# Patient Record
Sex: Female | Born: 1970 | Race: White | Hispanic: No | Smoking: Current every day smoker
Health system: Southern US, Community
[De-identification: ages and names within clinical notes are randomized; demographics above are authoritative.]

## PROBLEM LIST (undated history)

## (undated) DIAGNOSIS — M25569 Pain in unspecified knee: Secondary | ICD-10-CM

## (undated) DIAGNOSIS — J45909 Unspecified asthma, uncomplicated: Secondary | ICD-10-CM

## (undated) HISTORY — PX: ORTHOPEDIC SURGERY: SHX850

---

## 2013-06-06 ENCOUNTER — Encounter (HOSPITAL_COMMUNITY): Payer: Self-pay | Admitting: Emergency Medicine

## 2013-06-06 ENCOUNTER — Emergency Department (HOSPITAL_COMMUNITY): Payer: Self-pay

## 2013-06-06 DIAGNOSIS — J45909 Unspecified asthma, uncomplicated: Secondary | ICD-10-CM | POA: Insufficient documentation

## 2013-06-06 DIAGNOSIS — W208XXA Other cause of strike by thrown, projected or falling object, initial encounter: Secondary | ICD-10-CM | POA: Insufficient documentation

## 2013-06-06 DIAGNOSIS — Z880 Allergy status to penicillin: Secondary | ICD-10-CM | POA: Insufficient documentation

## 2013-06-06 DIAGNOSIS — Y9289 Other specified places as the place of occurrence of the external cause: Secondary | ICD-10-CM | POA: Insufficient documentation

## 2013-06-06 DIAGNOSIS — S60229A Contusion of unspecified hand, initial encounter: Secondary | ICD-10-CM | POA: Insufficient documentation

## 2013-06-06 DIAGNOSIS — Y93H2 Activity, gardening and landscaping: Secondary | ICD-10-CM | POA: Insufficient documentation

## 2013-06-06 DIAGNOSIS — F17200 Nicotine dependence, unspecified, uncomplicated: Secondary | ICD-10-CM | POA: Insufficient documentation

## 2013-06-06 DIAGNOSIS — Z79899 Other long term (current) drug therapy: Secondary | ICD-10-CM | POA: Insufficient documentation

## 2013-06-06 NOTE — ED Notes (Signed)
Patient states she was helping her husband work on the deck when the deck fell on her hand.  Patient has bruising noted.

## 2013-06-07 ENCOUNTER — Emergency Department (HOSPITAL_COMMUNITY)
Admission: EM | Admit: 2013-06-07 | Discharge: 2013-06-07 | Disposition: A | Discharge status: Home / Self Care | Payer: Self-pay | Attending: Emergency Medicine | Admitting: Emergency Medicine

## 2013-06-07 DIAGNOSIS — S60221A Contusion of right hand, initial encounter: Secondary | ICD-10-CM

## 2013-06-07 HISTORY — DX: Unspecified asthma, uncomplicated: J45.909

## 2013-06-07 MED ORDER — OXYCODONE-ACETAMINOPHEN 5-325 MG PO TABS: 1.0000 | ORAL_TABLET | ORAL | Status: DC | PRN

## 2013-06-07 MED ORDER — OXYCODONE-ACETAMINOPHEN 5-325 MG PO TABS
1.0000 | ORAL_TABLET | Freq: Once | ORAL | Status: AC
  Administered 2013-06-07: 1 via ORAL
  Filled 2013-06-07: qty 1

## 2013-06-07 NOTE — ED Notes (Signed)
Dr. Glick at bedside.  

## 2013-06-07 NOTE — ED Provider Notes (Signed)
CSN: [id number]     Arrival date & time 06/06/13  2303 History   First MD Initiated Contact with Patient 06/07/13 0316     Chief Complaint  Patient presents with  . Hand Injury     (Consider location/radiation/quality/duration/timing/severity/associated sxs/prior Treatment) Patient is a 43 y.o. female presenting with hand injury. The history is provided by the patient.  Hand Injury She was working on a lawnmower when it fell and injured her right hand. She is complaining of pain in the radial aspect of the right hand. Pain is severe and she rates it at 10/10. She denies other injury.  Past Medical History  Diagnosis Date  . Asthma    Past Surgical History  Procedure Laterality Date  . Orthopedic surgery     No family history on file. History  Substance Use Topics  . Smoking status: Current Every Day Smoker  . Smokeless tobacco: Not on file  . Alcohol Use: Yes   OB History   Grav Para Term Preterm Abortions TAB SAB Ect Mult Living                 Review of Systems  All other systems reviewed and are negative.     Allergies  Codeine; Ibuprofen; Neurontin; Penicillins; and Tramadol  Home Medications   Current Outpatient Rx  Name  Route  Sig  Dispense  Refill  . PARoxetine (PAXIL) 40 MG tablet   Oral   Take 40 mg by mouth every morning.          BP 119/72  Pulse 77  Temp(Src) 98.1 F (36.7 C) (Oral)  Resp 18  Ht 5\' 1"  (1.549 m)  Wt 126 lb (57.153 kg)  BMI 23.82 kg/m2  SpO2 100%  LMP 05/23/2013 Physical Exam  Nursing note and vitals reviewed.  43 year old female, resting comfortably and in no acute distress. Vital signs are normal. Oxygen saturation is 100%, which is normal. Head is normocephalic and atraumatic. PERRLA, EOMI. Oropharynx is clear. Neck is nontender and supple without adenopathy or JVD. Back is nontender and there is no CVA tenderness. Lungs are clear without rales, wheezes, or rhonchi. Chest is nontender. Heart has regular rate and  rhythm without murmur. Abdomen is soft, flat, nontender without masses or hepatosplenomegaly and peristalsis is normoactive. Extremities have no cyanosis or edema, full range of motion is present. There is ecchymosis and mild soft tissue swelling of the right hand over the region of the first, second, third metacarpals. There is no swelling or tenderness of the fingers or thumb and no wrist swelling or tenderness. Distal neurovascular exam is intact with prompt capillary refill and normal sensation. Skin is warm and dry without rash. Neurologic: Mental status is normal, cranial nerves are intact, there are no motor or sensory deficits.  ED Course  Procedures (including critical care time) Imaging Review Dg Hand Complete Right  06/06/2013   CLINICAL DATA:  Lateral posterior hand pain after crush injury today.  EXAM: RIGHT HAND - COMPLETE 3+ VIEW  COMPARISON:  None.  FINDINGS: Degenerative changes in the interphalangeal joints. There is no evidence of fracture or dislocation. There is no evidence of arthropathy or other focal bone abnormality. Soft tissues are unremarkable.  IMPRESSION: Negative.   Electronically Signed   By: Burman Nieves M.D.   On: 06/06/2013 23:52   MDM   Final diagnoses:  Contusion of right hand    Contusion of the right hand. Patient has allergy to NSAIDs and codeine but states  she is able to take oxycodone. She is given a prescription for oxycodone-acetaminophen and is given routine contusion instructions.    Dione Boozeavid Mandi Mattioli, MD 06/07/13 [phone]

## 2013-06-07 NOTE — Discharge Instructions (Signed)
Hand Contusion A hand contusion is a deep bruise on your hand area. Contusions are the result of an injury that caused bleeding under the skin. The contusion may turn blue, purple, or yellow. Minor injuries will give you a painless contusion, but more severe contusions may stay painful and swollen for a few weeks. CAUSES  A contusion is usually caused by a blow, trauma, or direct force to an area of the body. SYMPTOMS   Swelling and redness of the injured area.  Discoloration of the injured area.  Tenderness and soreness of the injured area.  Pain. DIAGNOSIS  The diagnosis can be made by taking a history and performing a physical exam. An X-ray, CT scan, or MRI may be needed to determine if there were any associated injuries, such as broken bones (fractures). TREATMENT  Often, the best treatment for a hand contusion is resting, elevating, icing, and applying cold compresses to the injured area. Over-the-counter medicines may also be recommended for pain control. HOME CARE INSTRUCTIONS   Put ice on the injured area.  Put ice in a plastic bag.  Place a towel between your skin and the bag.  Leave the ice on for 15-20 minutes, 03-04 times a day.  Only take over-the-counter or prescription medicines as directed by your caregiver. Your caregiver may recommend avoiding anti-inflammatory medicines (aspirin, ibuprofen, and naproxen) for 48 hours because these medicines may increase bruising.  If told, use an elastic wrap as directed. This can help reduce swelling. You may remove the wrap for sleeping, showering, and bathing. If your fingers become numb, cold, or blue, take the wrap off and reapply it more loosely.  Elevate your hand with pillows to reduce swelling.  Avoid overusing your hand if it is painful. SEEK IMMEDIATE MEDICAL CARE IF:   You have increased redness, swelling, or pain in your hand.  Your swelling or pain is not relieved with medicines.  You have loss of feeling in  your hand or are unable to move your fingers.  Your hand turns cold or blue.  You have pain when you move your fingers.  Your hand becomes warm to the touch.  Your contusion does not improve in 2 days. MAKE SURE YOU:   Understand these instructions.  Will watch your condition.  Will get help right away if you are not doing well or get worse. Document Released: 08/04/2001 Document Revised: 11/07/2011 Document Reviewed: 08/06/2011 [facility] Patient Information 2014 [employer], [place].  Acetaminophen; Oxycodone tablets What is this medicine? ACETAMINOPHEN; OXYCODONE (a set a MEE noe fen; ox i KOE done) is a pain reliever. It is used to treat mild to moderate pain. This medicine may be used for other purposes; ask your health care provider or pharmacist if you have questions. COMMON BRAND NAME(S): Endocet, Magnacet, Narvox, Percocet, Perloxx, Primalev, Primlev, Roxicet, Xolox What should I tell my health care provider before I take this medicine? They need to know if you have any of these conditions: -brain tumor -Crohn's disease, inflammatory bowel disease, or ulcerative colitis -drug abuse or addiction -head injury -heart or circulation problems -if you often drink alcohol -kidney disease or problems going to the bathroom -liver disease -lung disease, asthma, or breathing problems -an unusual or allergic reaction to acetaminophen, oxycodone, other opioid analgesics, other medicines, foods, dyes, or preservatives -pregnant or trying to get pregnant -breast-feeding How should I use this medicine? Take this medicine by mouth with a full glass of water. Follow the directions on the prescription label. Take your  medicine at regular intervals. Do not take your medicine more often than directed. Talk to your pediatrician regarding the use of this medicine in children. Special care may be needed. Patients over 48 years old may have a stronger reaction and need a smaller dose. Overdosage:  If you think you have taken too much of this medicine contact a poison control center or emergency room at once. NOTE: This medicine is only for you. Do not share this medicine with others. What if I miss a dose? If you miss a dose, take it as soon as you can. If it is almost time for your next dose, take only that dose. Do not take double or extra doses. What may interact with this medicine? -alcohol -antihistamines -barbiturates like amobarbital, butalbital, butabarbital, methohexital, pentobarbital, phenobarbital, thiopental, and secobarbital -benztropine -drugs for bladder problems like solifenacin, trospium, oxybutynin, tolterodine, hyoscyamine, and methscopolamine -drugs for breathing problems like ipratropium and tiotropium -drugs for certain stomach or intestine problems like propantheline, homatropine methylbromide, glycopyrrolate, atropine, belladonna, and dicyclomine -general anesthetics like etomidate, ketamine, nitrous oxide, propofol, desflurane, enflurane, halothane, isoflurane, and sevoflurane -medicines for depression, anxiety, or psychotic disturbances -medicines for sleep -muscle relaxants -naltrexone -narcotic medicines (opiates) for pain -phenothiazines like perphenazine, thioridazine, chlorpromazine, mesoridazine, fluphenazine, prochlorperazine, promazine, and trifluoperazine -scopolamine -tramadol -trihexyphenidyl This list may not describe all possible interactions. Give your health care provider a list of all the medicines, herbs, non-prescription drugs, or dietary supplements you use. Also tell them if you smoke, drink alcohol, or use illegal drugs. Some items may interact with your medicine. What should I watch for while using this medicine? Tell your doctor or health care professional if your pain does not go away, if it gets worse, or if you have new or a different type of pain. You may develop tolerance to the medicine. Tolerance means that you will need a higher  dose of the medication for pain relief. Tolerance is normal and is expected if you take this medicine for a long time. Do not suddenly stop taking your medicine because you may develop a severe reaction. Your body becomes used to the medicine. This does NOT mean you are addicted. Addiction is a behavior related to getting and using a drug for a non-medical reason. If you have pain, you have a medical reason to take pain medicine. Your doctor will tell you how much medicine to take. If your doctor wants you to stop the medicine, the dose will be slowly lowered over time to avoid any side effects. You may get drowsy or dizzy. Do not drive, use machinery, or do anything that needs mental alertness until you know how this medicine affects you. Do not stand or sit up quickly, especially if you are an older patient. This reduces the risk of dizzy or fainting spells. Alcohol may interfere with the effect of this medicine. Avoid alcoholic drinks. There are different types of narcotic medicines (opiates) for pain. If you take more than one type at the same time, you may have more side effects. Give your health care provider a list of all medicines you use. Your doctor will tell you how much medicine to take. Do not take more medicine than directed. Call emergency for help if you have problems breathing. The medicine will cause constipation. Try to have a bowel movement at least every 2 to 3 days. If you do not have a bowel movement for 3 days, call your doctor or health care professional. Do not take Tylenol (acetaminophen) or  medicines that have acetaminophen with this medicine. Too much acetaminophen can be very dangerous. Many nonprescription medicines contain acetaminophen. Always read the labels carefully to avoid taking more acetaminophen. What side effects may I notice from receiving this medicine? Side effects that you should report to your doctor or health care professional as soon as possible: -allergic  reactions like skin rash, itching or hives, swelling of the face, lips, or tongue -breathing difficulties, wheezing -confusion -light headedness or fainting spells -severe stomach pain -unusually weak or tired -yellowing of the skin or the whites of the eyes  Side effects that usually do not require medical attention (report to your doctor or health care professional if they continue or are bothersome): -dizziness -drowsiness -nausea -vomiting This list may not describe all possible side effects. Call your doctor for medical advice about side effects. You may report side effects to FDA at 1-800-FDA-1088. Where should I keep my medicine? Keep out of the reach of children. This medicine can be abused. Keep your medicine in a safe place to protect it from theft. Do not share this medicine with anyone. Selling or giving away this medicine is dangerous and against the law. Store at room temperature between 20 and 25 degrees C (68 and 77 degrees F). Keep container tightly closed. Protect from light. This medicine may cause accidental overdose and death if it is taken by other adults, children, or pets. Flush any unused medicine down the toilet to reduce the chance of harm. Do not use the medicine after the expiration date. NOTE: This sheet is a summary. It may not cover all possible information. If you have questions about this medicine, talk to your doctor, pharmacist, or health care provider.  2014, [employer]. (2012-10-06 13:17:35)

## 2013-06-08 MED FILL — Oxycodone w/ Acetaminophen Tab 5-325 MG: ORAL | Qty: 6 | Status: AC

## 2013-07-02 ENCOUNTER — Encounter (HOSPITAL_COMMUNITY): Payer: Self-pay | Admitting: Emergency Medicine

## 2013-07-02 DIAGNOSIS — F17200 Nicotine dependence, unspecified, uncomplicated: Secondary | ICD-10-CM | POA: Insufficient documentation

## 2013-07-02 DIAGNOSIS — N949 Unspecified condition associated with female genital organs and menstrual cycle: Secondary | ICD-10-CM | POA: Insufficient documentation

## 2013-07-02 DIAGNOSIS — J45909 Unspecified asthma, uncomplicated: Secondary | ICD-10-CM | POA: Insufficient documentation

## 2013-07-02 DIAGNOSIS — Z880 Allergy status to penicillin: Secondary | ICD-10-CM | POA: Insufficient documentation

## 2013-07-02 DIAGNOSIS — N925 Other specified irregular menstruation: Secondary | ICD-10-CM | POA: Insufficient documentation

## 2013-07-02 DIAGNOSIS — N938 Other specified abnormal uterine and vaginal bleeding: Secondary | ICD-10-CM | POA: Insufficient documentation

## 2013-07-02 DIAGNOSIS — M25569 Pain in unspecified knee: Secondary | ICD-10-CM | POA: Insufficient documentation

## 2013-07-02 DIAGNOSIS — Z9889 Other specified postprocedural states: Secondary | ICD-10-CM | POA: Insufficient documentation

## 2013-07-02 NOTE — ED Notes (Signed)
Patient reports bilateral knee pain x 3 weeks. States history of torn tendon to right knee. Patient also reports heavy vaginal bleeding x 2 days.

## 2013-07-03 ENCOUNTER — Emergency Department (HOSPITAL_COMMUNITY)
Admission: EM | Admit: 2013-07-03 | Discharge: 2013-07-03 | Disposition: A | Discharge status: Home / Self Care | Payer: Self-pay | Attending: Emergency Medicine | Admitting: Emergency Medicine

## 2013-07-03 DIAGNOSIS — M25562 Pain in left knee: Secondary | ICD-10-CM

## 2013-07-03 DIAGNOSIS — M25561 Pain in right knee: Secondary | ICD-10-CM

## 2013-07-03 DIAGNOSIS — N938 Other specified abnormal uterine and vaginal bleeding: Secondary | ICD-10-CM

## 2013-07-03 HISTORY — DX: Pain in unspecified knee: M25.569

## 2013-07-03 LAB — WET PREP, GENITAL
TRICH WET PREP: NONE SEEN
WBC, Wet Prep HPF POC: NONE SEEN
Yeast Wet Prep HPF POC: NONE SEEN

## 2013-07-03 LAB — RPR

## 2013-07-03 LAB — HIV ANTIBODY (ROUTINE TESTING W REFLEX): HIV 1&2 Ab, 4th Generation: NONREACTIVE

## 2013-07-03 MED ORDER — MEDROXYPROGESTERONE ACETATE 10 MG PO TABS: 10.0000 mg | ORAL_TABLET | Freq: Every day | ORAL | Status: DC

## 2013-07-03 MED ORDER — MEDROXYPROGESTERONE ACETATE 10 MG PO TABS
10.0000 mg | ORAL_TABLET | Freq: Every day | ORAL | Status: DC
  Administered 2013-07-03: 10 mg via ORAL
  Filled 2013-07-03 (×2): qty 1

## 2013-07-03 MED ORDER — OXYCODONE-ACETAMINOPHEN 5-325 MG PO TABS: 1.0000 | ORAL_TABLET | ORAL | Status: DC | PRN

## 2013-07-03 MED ORDER — MEDROXYPROGESTERONE ACETATE 10 MG PO TABS
ORAL_TABLET | ORAL | Status: AC
  Filled 2013-07-03: qty 1

## 2013-07-03 MED ORDER — OXYCODONE-ACETAMINOPHEN 5-325 MG PO TABS
1.0000 | ORAL_TABLET | Freq: Once | ORAL | Status: AC
  Administered 2013-07-03: 1 via ORAL
  Filled 2013-07-03: qty 1

## 2013-07-03 NOTE — ED Provider Notes (Signed)
CSN: [id number]     Arrival date & time 07/02/13  2215 History   First MD Initiated Contact with Patient 07/03/13 0125     Chief Complaint  Patient presents with  . Knee Pain  . Vaginal Bleeding     (Consider location/radiation/quality/duration/timing/severity/associated sxs/prior Treatment) Patient is a 43 y.o. female presenting with knee pain and vaginal bleeding. The history is provided by the patient.  Knee Pain Vaginal Bleeding She injured her right knee about 3 weeks ago and has been in a knee brace since then. She's complaining of a brace causing her to break out around her knee and she continues to have pain in her knee. Since then, she has been having increasing pain in her left knee and notices that it locks on her. [facility] but that appointment is not for another 2 weeks. She states that she or a tendon in the medial aspect of her knee when she tripped over her dog. Also, if she is having heavy vaginal bleeding. Her menses started 2 days ago and she has been using 8 pads a day with passage of large clots. There is also been some suprapubic cramping. She rates her pain at 8/10. She took ibuprofen and acetaminophen with no relief. She took a dose of oxycodone-acetaminophen with good relief she does state that for the last 3 months, she has been having to periods a month. She is status post tubal ligation.  Past Medical History  Diagnosis Date  . Asthma   . Knee pain    Past Surgical History  Procedure Laterality Date  . Orthopedic surgery     History reviewed. No pertinent family history. History  Substance Use Topics  . Smoking status: Current Every Day Smoker  . Smokeless tobacco: Not on file  . Alcohol Use: Yes   OB History   Grav Para Term Preterm Abortions TAB SAB Ect Mult Living                 Review of Systems  Genitourinary: Positive for vaginal bleeding.  All other systems reviewed and are  negative.     Allergies  Codeine; Ibuprofen; Neurontin; Penicillins; and Tramadol  Home Medications   Prior to Admission medications   Medication Sig Start Date End Date Taking? Authorizing Provider  oxyCODONE-acetaminophen (PERCOCET/ROXICET) 5-325 MG per tablet Take 1 tablet by mouth every 4 (four) hours as needed for severe pain. 06/07/13   Dione Boozeavid Esta Carmon, MD  oxyCODONE-acetaminophen (PERCOCET/ROXICET) 5-325 MG per tablet Take 1 tablet by mouth every 4 (four) hours as needed. 06/07/13   Dione Boozeavid Braelee Herrle, MD  PARoxetine (PAXIL) 40 MG tablet Take 40 mg by mouth every morning.    Historical Provider, MD   BP 106/61  Pulse 75  Temp(Src) 97.8 F (36.6 C) (Oral)  Resp 18  Ht 5\' 1"  (1.549 m)  Wt 128 lb (58.06 kg)  BMI 24.20 kg/m2  SpO2 100%  LMP 06/30/2013 Physical Exam  Nursing note and vitals reviewed.  43 year old female, resting comfortably and in no acute distress. Vital signs are normal. Oxygen saturation is 100%, which is normal. Head is normocephalic and atraumatic. PERRLA, EOMI. Oropharynx is clear. Neck is nontender and supple without adenopathy or JVD. Back is nontender and there is no CVA tenderness. Lungs are clear without rales, wheezes, or rhonchi. Chest is nontender. Heart has regular rate and rhythm without murmur. Abdomen is soft, flat, with mild suprapubic tenderness. There is no  rebound or guarding. There are no masses or hepatosplenomegaly and peristalsis is normoactive. Pelvic: Normal external female genitalia. Moderate amount of dark red blood present in the vaginal vault without any active bleeding from the cervix. Cervix is closed. Fundus is top normal in size. There is no adnexal masses or tenderness and there is no cervical motion tenderness. Extremities have no cyanosis or edema. Small effusion is present in the right knee. There is tenderness palpation over the medial aspect of the knee. There is no instability. Lachman test is negative. McMurray's tests suggests a  very subtle click medially. Left knee has no tenderness or effusion. There is no instability. Lachman and McMurray's tests are negative but pain is elicited with McMurray's tests.. Skin is warm and dry without rash. Neurologic: Mental status is normal, cranial nerves are intact, there are no motor or sensory deficits.  ED Course  Procedures (including critical care time) Labs Review Results for orders placed during the hospital encounter of 07/03/13  WET PREP, GENITAL      Result Value Ref Range   Yeast Wet Prep HPF POC NONE SEEN  NONE SEEN   Trich, Wet Prep NONE SEEN  NONE SEEN   Clue Cells Wet Prep HPF POC FEW (*) NONE SEEN   WBC, Wet Prep HPF POC NONE SEEN  NONE SEEN   MDM   Final diagnoses:  Dysfunctional uterine bleeding  Pain in right knee  Pain in left knee    Dysfunctional uterine bleeding. Knee injury which is likely torn medial meniscus. Left knee pain also suggestive of a meniscal tear. Pelvic exam will be done and I anticipate sending her home with prescription for medroxyprogesterone and oxycodone-acetaminophen as well as naproxen.    Dione Boozeavid Yannely Kintzel, MD 07/03/13 0300

## 2013-07-03 NOTE — ED Provider Notes (Signed)
T/C [facility] [place]: he states pt is there requesting to fill the percocet rx but not the provera rx, this request concerned the pharmacist so he accessed the [place]MP Database; in the past 3 weeks, pt has filled 7 different narcotic pain prescriptions written by 6 different providers from 5 different hospitals at 2 different pharmacies. Pharmacist stated this information greatly concerned him so he called our ED for further instructions. Pharmacist was instructed to shred and dispose of pt's narcotic pain medication prescription. Will allow provera rx to be filled, but Pharmacist states pt is refusing it. Pharmacist thankful we shared his concern.   Laray AngerKathleen M Izzabelle Bouley, DO 07/03/13 1549

## 2013-07-03 NOTE — Discharge Instructions (Signed)
Keep your appointment with the orthopedic doctor in [place]. However, if you wish to see an orthopedic doctor locally, call Dr. Sanjuan Dame office for an appointment.   Abnormal Uterine Bleeding Abnormal uterine bleeding can affect women at various stages in life, including teenagers, women in their reproductive years, pregnant women, and women who have reached menopause. Several kinds of uterine bleeding are considered abnormal, including:  Bleeding or spotting between periods.   Bleeding after sexual intercourse.   Bleeding that is heavier or more than normal.   Periods that last longer than usual.  Bleeding after menopause.  Many cases of abnormal uterine bleeding are minor and simple to treat, while others are more serious. Any type of abnormal bleeding should be evaluated by your health care provider. Treatment will depend on the cause of the bleeding. HOME CARE INSTRUCTIONS Monitor your condition for any changes. The following actions may help to alleviate any discomfort you are experiencing:  Avoid the use of tampons and douches as directed by your health care provider.  Change your pads frequently. You should get regular pelvic exams and Pap tests. Keep all follow-up appointments for diagnostic tests as directed by your health care provider.  SEEK MEDICAL CARE IF:   Your bleeding lasts more than 1 week.   You feel dizzy at times.  SEEK IMMEDIATE MEDICAL CARE IF:   You pass out.   You are changing pads every 15 to 30 minutes.   You have abdominal pain.  You have a fever.   You become sweaty or weak.   You are passing large blood clots from the vagina.   You start to feel nauseous and vomit. MAKE SURE YOU:   Understand these instructions.  Will watch your condition.  Will get help right away if you are not doing well or get worse. Document Released: 02/12/2005 Document Revised: 10/15/2012 Document Reviewed: 09/11/2012 [facility] Patient Information  2014 [place], [place].  Medroxyprogesterone tablets What is this medicine? MEDROXYPROGESTERONE (me DROX ee proe JES te rone) is a hormone in a class called progestins. It is commonly used to prevent the uterine lining from overgrowth in women taking an estrogen after menopause. It is also used to treat irregular menstrual bleeding or a lack of menstrual bleeding in women. This medicine may be used for other purposes; ask your health care provider or pharmacist if you have questions. COMMON BRAND NAME(S): Amen, Provera What should I tell my health care provider before I take this medicine? They need to know if you have any of these conditions: -blood vessel disease or a history of a blood clot in the lungs or legs -breast, cervical or vaginal cancer -heart disease -kidney disease -liver disease -migraine -recent miscarriage or abortion -mental depression -migraine -seizures (convulsions) -stroke -vaginal bleeding that has not been evaluated -an unusual or allergic reaction to medroxyprogesterone, other medicines, foods, dyes, or preservatives -pregnant or trying to get pregnant -breast-feeding How should I use this medicine? Take this medicine by mouth with a glass of water. Follow the directions on the prescription label. Take your doses at regular intervals. Do not take your medicine more often than directed. Talk to your pediatrician regarding the use of this medicine in children. Special care may be needed. While this drug may be prescribed for children as young as 13 years for selected conditions, precautions do apply. Overdosage: If you think you have taken too much of this medicine contact a poison control center or emergency room at once. NOTE: This medicine is only  for you. Do not share this medicine with others. What if I miss a dose? If you miss a dose, take it as soon as you can. If it is almost time for your next dose, take only that dose. Do not take double or extra  doses. What may interact with this medicine? -barbiturate medicines for inducing sleep or treating seizures (convulsions) -bosentan -carbamazepine -phenytoin -rifampin -St. John's Wort This list may not describe all possible interactions. Give your health care provider a list of all the medicines, herbs, non-prescription drugs, or dietary supplements you use. Also tell them if you smoke, drink alcohol, or use illegal drugs. Some items may interact with your medicine. What should I watch for while using this medicine? Visit your health care professional for regular checks on your progress. You will need a regular breast and pelvic exam. If you have any reason to think you are pregnant, stop taking this medicine at once and contact your doctor or health care professional. What side effects may I notice from receiving this medicine? Side effects that you should report to your doctor or health care professional as soon as possible: -breast tenderness or discharge -changes in mood or emotions, such as depression -changes in vision or speech -pain in the abdomen, chest, groin, or leg -severe headache -skin rash, itching, or hives -sudden shortness of breath -unusually weak or tired -yellowing of skin or eyes Side effects that usually do not require medical attention (report to your doctor or health care professional if they continue or are bothersome): -acne -change in menstrual bleeding pattern or flow -changes in sexual desire -facial hair growth -fluid retention and swelling -headache -upset stomach -weight gain or loss This list may not describe all possible side effects. Call your doctor for medical advice about side effects. You may report side effects to FDA at 1-800-FDA-1088. Where should I keep my medicine? Keep out of the reach of children. Store at room temperature between 20 and 25 degrees C (68 and 77 degrees F). Throw away any unused medicine after the expiration  date. NOTE: This sheet is a summary. It may not cover all possible information. If you have questions about this medicine, talk to your doctor, pharmacist, or health care provider.  2014, Elsevier/Gold Standard. (2008-02-12 11:26:12)  Acetaminophen; Oxycodone tablets What is this medicine? ACETAMINOPHEN; OXYCODONE (a set a MEE noe fen; ox i KOE done) is a pain reliever. It is used to treat mild to moderate pain. This medicine may be used for other purposes; ask your health care provider or pharmacist if you have questions. COMMON BRAND NAME(S): Endocet, Magnacet, Narvox, Percocet, Perloxx, Primalev, Primlev, Roxicet, Xolox What should I tell my health care provider before I take this medicine? They need to know if you have any of these conditions: -brain tumor -Crohn's disease, inflammatory bowel disease, or ulcerative colitis -drug abuse or addiction -head injury -heart or circulation problems -if you often drink alcohol -kidney disease or problems going to the bathroom -liver disease -lung disease, asthma, or breathing problems -an unusual or allergic reaction to acetaminophen, oxycodone, other opioid analgesics, other medicines, foods, dyes, or preservatives -pregnant or trying to get pregnant -breast-feeding How should I use this medicine? Take this medicine by mouth with a full glass of water. Follow the directions on the prescription label. Take your medicine at regular intervals. Do not take your medicine more often than directed. Talk to your pediatrician regarding the use of this medicine in children. Special care may be needed. Patients  over 41 years old may have a stronger reaction and need a smaller dose. Overdosage: If you think you have taken too much of this medicine contact a poison control center or emergency room at once. NOTE: This medicine is only for you. Do not share this medicine with others. What if I miss a dose? If you miss a dose, take it as soon as you can.  If it is almost time for your next dose, take only that dose. Do not take double or extra doses. What may interact with this medicine? -alcohol -antihistamines -barbiturates like amobarbital, butalbital, butabarbital, methohexital, pentobarbital, phenobarbital, thiopental, and secobarbital -benztropine -drugs for bladder problems like solifenacin, trospium, oxybutynin, tolterodine, hyoscyamine, and methscopolamine -drugs for breathing problems like ipratropium and tiotropium -drugs for certain stomach or intestine problems like propantheline, homatropine methylbromide, glycopyrrolate, atropine, belladonna, and dicyclomine -general anesthetics like etomidate, ketamine, nitrous oxide, propofol, desflurane, enflurane, halothane, isoflurane, and sevoflurane -medicines for depression, anxiety, or psychotic disturbances -medicines for sleep -muscle relaxants -naltrexone -narcotic medicines (opiates) for pain -phenothiazines like perphenazine, thioridazine, chlorpromazine, mesoridazine, fluphenazine, prochlorperazine, promazine, and trifluoperazine -scopolamine -tramadol -trihexyphenidyl This list may not describe all possible interactions. Give your health care provider a list of all the medicines, herbs, non-prescription drugs, or dietary supplements you use. Also tell them if you smoke, drink alcohol, or use illegal drugs. Some items may interact with your medicine. What should I watch for while using this medicine? Tell your doctor or health care professional if your pain does not go away, if it gets worse, or if you have new or a different type of pain. You may develop tolerance to the medicine. Tolerance means that you will need a higher dose of the medication for pain relief. Tolerance is normal and is expected if you take this medicine for a long time. Do not suddenly stop taking your medicine because you may develop a severe reaction. Your body becomes used to the medicine. This does NOT mean  you are addicted. Addiction is a behavior related to getting and using a drug for a non-medical reason. If you have pain, you have a medical reason to take pain medicine. Your doctor will tell you how much medicine to take. If your doctor wants you to stop the medicine, the dose will be slowly lowered over time to avoid any side effects. You may get drowsy or dizzy. Do not drive, use machinery, or do anything that needs mental alertness until you know how this medicine affects you. Do not stand or sit up quickly, especially if you are an older patient. This reduces the risk of dizzy or fainting spells. Alcohol may interfere with the effect of this medicine. Avoid alcoholic drinks. There are different types of narcotic medicines (opiates) for pain. If you take more than one type at the same time, you may have more side effects. Give your health care provider a list of all medicines you use. Your doctor will tell you how much medicine to take. Do not take more medicine than directed. Call emergency for help if you have problems breathing. The medicine will cause constipation. Try to have a bowel movement at least every 2 to 3 days. If you do not have a bowel movement for 3 days, call your doctor or health care professional. Do not take Tylenol (acetaminophen) or medicines that have acetaminophen with this medicine. Too much acetaminophen can be very dangerous. Many nonprescription medicines contain acetaminophen. Always read the labels carefully to avoid taking more acetaminophen. What side  effects may I notice from receiving this medicine? Side effects that you should report to your doctor or health care professional as soon as possible: -allergic reactions like skin rash, itching or hives, swelling of the face, lips, or tongue -breathing difficulties, wheezing -confusion -light headedness or fainting spells -severe stomach pain -unusually weak or tired -yellowing of the skin or the whites of the eyes   Side effects that usually do not require medical attention (report to your doctor or health care professional if they continue or are bothersome): -dizziness -drowsiness -nausea -vomiting This list may not describe all possible side effects. Call your doctor for medical advice about side effects. You may report side effects to FDA at 1-800-FDA-1088. Where should I keep my medicine? Keep out of the reach of children. This medicine can be abused. Keep your medicine in a safe place to protect it from theft. Do not share this medicine with anyone. Selling or giving away this medicine is dangerous and against the law. Store at room temperature between 20 and 25 degrees C (68 and 77 degrees F). Keep container tightly closed. Protect from light. This medicine may cause accidental overdose and death if it is taken by other adults, children, or pets. Flush any unused medicine down the toilet to reduce the chance of harm. Do not use the medicine after the expiration date. NOTE: This sheet is a summary. It may not cover all possible information. If you have questions about this medicine, talk to your doctor, pharmacist, or health care provider.  2014, Elsevier/Gold Standard. (2012-10-06 13:17:35)

## 2013-07-04 LAB — GC/CHLAMYDIA PROBE AMP
CT Probe RNA: NEGATIVE
GC Probe RNA: NEGATIVE

## 2013-12-04 ENCOUNTER — Emergency Department (HOSPITAL_COMMUNITY)
Admission: EM | Admit: 2013-12-04 | Discharge: 2013-12-05 | Disposition: A | Discharge status: Home / Self Care | Payer: Self-pay | Attending: Emergency Medicine | Admitting: Emergency Medicine

## 2013-12-04 ENCOUNTER — Emergency Department (HOSPITAL_COMMUNITY): Payer: Self-pay

## 2013-12-04 ENCOUNTER — Encounter (HOSPITAL_COMMUNITY): Payer: Self-pay | Admitting: Emergency Medicine

## 2013-12-04 DIAGNOSIS — Z720 Tobacco use: Secondary | ICD-10-CM | POA: Insufficient documentation

## 2013-12-04 DIAGNOSIS — Y9289 Other specified places as the place of occurrence of the external cause: Secondary | ICD-10-CM | POA: Insufficient documentation

## 2013-12-04 DIAGNOSIS — S92512A Displaced fracture of proximal phalanx of left lesser toe(s), initial encounter for closed fracture: Secondary | ICD-10-CM | POA: Insufficient documentation

## 2013-12-04 DIAGNOSIS — W228XXA Striking against or struck by other objects, initial encounter: Secondary | ICD-10-CM | POA: Insufficient documentation

## 2013-12-04 DIAGNOSIS — S92912A Unspecified fracture of left toe(s), initial encounter for closed fracture: Secondary | ICD-10-CM

## 2013-12-04 DIAGNOSIS — F419 Anxiety disorder, unspecified: Secondary | ICD-10-CM | POA: Insufficient documentation

## 2013-12-04 DIAGNOSIS — Z79899 Other long term (current) drug therapy: Secondary | ICD-10-CM | POA: Insufficient documentation

## 2013-12-04 DIAGNOSIS — Y9389 Activity, other specified: Secondary | ICD-10-CM | POA: Insufficient documentation

## 2013-12-04 DIAGNOSIS — R110 Nausea: Secondary | ICD-10-CM | POA: Insufficient documentation

## 2013-12-04 DIAGNOSIS — Z7952 Long term (current) use of systemic steroids: Secondary | ICD-10-CM | POA: Insufficient documentation

## 2013-12-04 DIAGNOSIS — J45909 Unspecified asthma, uncomplicated: Secondary | ICD-10-CM | POA: Insufficient documentation

## 2013-12-04 DIAGNOSIS — Z880 Allergy status to penicillin: Secondary | ICD-10-CM | POA: Insufficient documentation

## 2013-12-04 NOTE — ED Provider Notes (Signed)
CSN: [id number]     Arrival date & time 12/04/13  2240 History   First MD Initiated Contact with Patient 12/04/13 2304     Chief Complaint  Patient presents with  . Toe Pain     (Consider location/radiation/quality/duration/timing/severity/associated sxs/prior Treatment) Patient is a 43 y.o. female presenting with toe pain. The history is provided by the patient.  Toe Pain This is a new problem. The current episode started yesterday. The problem occurs constantly. The problem has been gradually worsening. Associated symptoms include arthralgias, joint swelling and nausea. Pertinent negatives include no abdominal pain, chest pain, coughing, neck pain or numbness. The symptoms are aggravated by walking. She has tried nothing for the symptoms. The treatment provided no relief.    Past Medical History  Diagnosis Date  . Asthma   . Knee pain    Past Surgical History  Procedure Laterality Date  . Orthopedic surgery     History reviewed. No pertinent family history. History  Substance Use Topics  . Smoking status: Current Every Day Smoker  . Smokeless tobacco: Not on file  . Alcohol Use: No     Comment: quit in July 2015   OB History   Grav Para Term Preterm Abortions TAB SAB Ect Mult Living                 Review of Systems  Constitutional: Negative for activity change.       All ROS Neg except as noted in HPI  Eyes: Negative for photophobia and discharge.  Respiratory: Negative for cough, shortness of breath and wheezing.   Cardiovascular: Negative for chest pain and palpitations.  Gastrointestinal: Positive for nausea. Negative for abdominal pain and blood in stool.  Genitourinary: Negative for dysuria, frequency and hematuria.  Musculoskeletal: Positive for arthralgias and joint swelling. Negative for back pain and neck pain.  Skin: Negative.   Neurological: Negative for dizziness, seizures, speech difficulty and numbness.  Psychiatric/Behavioral: Negative for  hallucinations and confusion. The patient is nervous/anxious.       Allergies  Codeine; Ibuprofen; Neurontin; Penicillins; and Tramadol  Home Medications   Prior to Admission medications   Medication Sig Start Date End Date Taking? Authorizing Provider  gabapentin (NEURONTIN) 300 MG capsule Take 300 mg by mouth 3 (three) times daily.   Yes Historical Provider, MD  hydrOXYzine (ATARAX/VISTARIL) 25 MG tablet Take 25 mg by mouth 3 (three) times daily.   Yes Historical Provider, MD  PARoxetine (PAXIL) 40 MG tablet Take 40 mg by mouth every morning.   Yes Historical Provider, MD  medroxyPROGESTERone (PROVERA) 10 MG tablet Take 1 tablet (10 mg total) by mouth daily. 07/03/13   Dione Boozeavid Glick, MD  oxyCODONE-acetaminophen (PERCOCET/ROXICET) 5-325 MG per tablet Take 1 tablet by mouth every 4 (four) hours as needed for severe pain. 06/07/13   Dione Boozeavid Glick, MD  oxyCODONE-acetaminophen (PERCOCET/ROXICET) 5-325 MG per tablet Take 1 tablet by mouth every 4 (four) hours as needed. 06/07/13   Dione Boozeavid Glick, MD  oxyCODONE-acetaminophen (PERCOCET/ROXICET) 5-325 MG per tablet Take 1 tablet by mouth every 4 (four) hours as needed. 07/03/13   Dione Boozeavid Glick, MD   BP 144/95  Pulse 93  Temp(Src) 98.5 F (36.9 C) (Oral)  Resp 18  Ht 5\' 1"  (1.549 m)  Wt 130 lb (58.968 kg)  BMI 24.58 kg/m2  SpO2 97%  LMP 11/27/2013 Physical Exam  Nursing note and vitals reviewed. Constitutional: She is oriented to person, place, and time. She appears well-developed and well-nourished.  Non-toxic appearance.  HENT:  Head: Normocephalic.  Right Ear: Tympanic membrane and external ear normal.  Left Ear: Tympanic membrane and external ear normal.  Eyes: EOM and lids are normal. Pupils are equal, round, and reactive to light.  Neck: Normal range of motion. Neck supple. Carotid bruit is not present.  Cardiovascular: Normal rate, regular rhythm, normal heart sounds, intact distal pulses and normal pulses.   Pulmonary/Chest: Breath sounds  normal. No respiratory distress.  Abdominal: Soft. Bowel sounds are normal. There is no tenderness. There is no guarding.  Musculoskeletal: Normal range of motion.       Feet:  Pt wearing walker boots on both feet due to foot fractures. Capillary refill less than 2 seconds. Dorsalis pedis pulse is 2+ bilaterally.  Lymphadenopathy:       Head (right side): No submandibular adenopathy present.       Head (left side): No submandibular adenopathy present.    She has no cervical adenopathy.  Neurological: She is alert and oriented to person, place, and time. She has normal strength. No cranial nerve deficit or sensory deficit.  Skin: Skin is warm and dry.  Psychiatric: Her speech is normal. Her mood appears anxious.    ED Course  Procedures (including critical care time) Labs Review Labs Reviewed - No data to display  Imaging Review No results found.   EKG Interpretation None      MDM  Patient sustained injury to the left second toe. Patient states she has had injury to this area approximately 4 years ago. The x-ray reveals oblique fracture of the proximal phalanx of the second digit with slight displacement time. No neurovascular compromise noted on examination. The toes were buddy taped, the patient is already in walking boots because of fractures to the bones in both feet. The patient states that she has run out of her pain medication from previous injuries. She states that her physician told her to come to the emergency department for assistance with her pain until she can get to the doctor's office next week. Prescription for Norco 7.5 mg given to the patient.    Final diagnoses:  None    **I have reviewed nursing notes, vital signs, and all appropriate lab and imaging results for this patient..*    Kathie DikeHobson M Layden Caterino, PA-C 12/05/13 1135

## 2013-12-04 NOTE — ED Notes (Signed)
Patient states she "stubbed her toe on her couch" patient states "my doctor told me if I run out of pain medication I need to come to the ER" patient is in bilateral cam walker boots from previous injury that she is already being treated for.

## 2013-12-05 MED ORDER — MORPHINE SULFATE 10 MG/ML IJ SOLN
10.0000 mg | Freq: Once | INTRAMUSCULAR | Status: AC
  Administered 2013-12-05: 10 mg via INTRAMUSCULAR
  Filled 2013-12-05: qty 1

## 2013-12-05 MED ORDER — HYDROCODONE-ACETAMINOPHEN 7.5-325 MG PO TABS: 1.0000 | ORAL_TABLET | ORAL | Status: DC | PRN

## 2013-12-05 MED ORDER — ONDANSETRON HCL 4 MG PO TABS
4.0000 mg | ORAL_TABLET | Freq: Once | ORAL | Status: AC
  Administered 2013-12-05: 4 mg via ORAL
  Filled 2013-12-05: qty 1

## 2013-12-05 NOTE — ED Provider Notes (Signed)
Medical screening examination/treatment/procedure(s) were performed by non-physician practitioner and as supervising physician I was immediately available for consultation/collaboration.   EKG Interpretation None        Shon Batonourtney F Kaniesha Barile, MD 12/05/13 [place]ina [id number]

## 2013-12-05 NOTE — Discharge Instructions (Signed)
Your x-ray reveals a fracture of the left second toe. Please continue to use your. Please keep your feet elevated above your waist to prevent swelling and increased pain. Please see your doctor in [place] for followup and management of this fracture. Toe Fracture A toe fracture is a break in the bone of a toe. It may take 6 to 8 weeks for the toe injury to heal. HOME CARE  "Buddy taping" is taping the broken toe to the toe next to it. Leave the toes taped together for at least 1 week or as told by your doctor. Change the tape after bathing. Always use a small piece of gauze or cotton between the toes when taping them together.  Put ice on the injured area.  Put ice in a plastic bag.  Place a towel between your skin and the bag.  Leave the ice on for 15-20 minutes, 03-04 times a day.  After the first 2 days, put heat on the injured area. Use heat for the next 2 to 3 days. Put a heating pad on the foot or soak the foot in warm water as told by your doctor.  Keep the foot raised (elevated) above the level of your heart.  Wear sturdy, supportive shoes. The shoes should not pinch the toes or fit tightly against the toes.  Use a cast shoe (if prescribed) if the foot is very puffy (swollen).  Use crutches if you have pain or it hurts too much to walk.  Only take medicine as told by your doctor.  Follow up with your doctor as told. GET HELP RIGHT AWAY IF:   There is pain or puffiness that is not helped by medicine.  The pain does not get better after 1 week.  The toe is cold when the others are warm.  The toe loses feeling (numb) or turns white.  The toe becomes hot and red (inflamed). MAKE SURE YOU:   Understand these instructions.  Will watch this condition.  Will get help right away if you are not doing well or get worse. Document Released: 08/01/2007 Document Revised: 05/07/2011 Document Reviewed: 07/08/2009 [facility] Patient Information 2015 [place], [place]. This  information is not intended to replace advice given to you by your health care provider. Make sure you discuss any questions you have with your health care provider.

## 2013-12-16 ENCOUNTER — Emergency Department (HOSPITAL_COMMUNITY): Payer: Self-pay

## 2013-12-16 ENCOUNTER — Emergency Department (HOSPITAL_COMMUNITY)
Admission: EM | Admit: 2013-12-16 | Discharge: 2013-12-16 | Disposition: A | Discharge status: Home / Self Care | Payer: Self-pay | Attending: Emergency Medicine | Admitting: Emergency Medicine

## 2013-12-16 ENCOUNTER — Encounter (HOSPITAL_COMMUNITY): Payer: Self-pay | Admitting: Emergency Medicine

## 2013-12-16 DIAGNOSIS — S92512A Displaced fracture of proximal phalanx of left lesser toe(s), initial encounter for closed fracture: Secondary | ICD-10-CM | POA: Insufficient documentation

## 2013-12-16 DIAGNOSIS — W2209XA Striking against other stationary object, initial encounter: Secondary | ICD-10-CM | POA: Insufficient documentation

## 2013-12-16 DIAGNOSIS — Z793 Long term (current) use of hormonal contraceptives: Secondary | ICD-10-CM | POA: Insufficient documentation

## 2013-12-16 DIAGNOSIS — Z79899 Other long term (current) drug therapy: Secondary | ICD-10-CM | POA: Insufficient documentation

## 2013-12-16 DIAGNOSIS — J45909 Unspecified asthma, uncomplicated: Secondary | ICD-10-CM | POA: Insufficient documentation

## 2013-12-16 DIAGNOSIS — Y9289 Other specified places as the place of occurrence of the external cause: Secondary | ICD-10-CM | POA: Insufficient documentation

## 2013-12-16 DIAGNOSIS — S92912A Unspecified fracture of left toe(s), initial encounter for closed fracture: Secondary | ICD-10-CM

## 2013-12-16 DIAGNOSIS — Z720 Tobacco use: Secondary | ICD-10-CM | POA: Insufficient documentation

## 2013-12-16 DIAGNOSIS — Y93E9 Activity, other interior property and clothing maintenance: Secondary | ICD-10-CM | POA: Insufficient documentation

## 2013-12-16 DIAGNOSIS — Z880 Allergy status to penicillin: Secondary | ICD-10-CM | POA: Insufficient documentation

## 2013-12-16 MED ORDER — ONDANSETRON HCL 4 MG PO TABS
4.0000 mg | ORAL_TABLET | Freq: Once | ORAL | Status: AC
  Administered 2013-12-16: 4 mg via ORAL
  Filled 2013-12-16: qty 1

## 2013-12-16 MED ORDER — HYDROCODONE-ACETAMINOPHEN 5-325 MG PO TABS
2.0000 | ORAL_TABLET | Freq: Once | ORAL | Status: AC
  Administered 2013-12-16: 2 via ORAL
  Filled 2013-12-16: qty 2

## 2013-12-16 NOTE — Discharge Instructions (Signed)
Your x-ray reveals a new fracture at the base of the second and third toes of the left foot. The fracture of the second toe (proximal) is beginning to show evidence of healing. The fracture of the first toe is also beginning to show evidence of healing. Please see your physician in [place] for additional evaluation and management of your discomfort. Please keep your foot elevated above your waist, and apply ice. You may continue to use your fracture boot that you have at home. Toe Fracture Your caregiver has diagnosed you as having a fractured toe. A toe fracture is a break in the bone of a toe. "Buddy taping" is a way of splinting your broken toe, by taping the broken toe to the toe next to it. This "buddy taping" will keep the injured toe from moving beyond normal range of motion. Buddy taping also helps the toe heal in a more normal alignment. It may take 6 to 8 weeks for the toe injury to heal. HOME CARE INSTRUCTIONS   Leave your toes taped together for as long as directed by your caregiver or until you see a doctor for a follow-up examination. You can change the tape after bathing. Always use a small piece of gauze or cotton between the toes when taping them together. This will help the skin stay dry and prevent infection.  Apply ice to the injury for 15-20 minutes each hour while awake for the first 2 days. Put the ice in a plastic bag and place a towel between the bag of ice and your skin.  After the first 2 days, apply heat to the injured area. Use heat for the next 2 to 3 days. Place a heating pad on the foot or soak the foot in warm water as directed by your caregiver.  Keep your foot elevated as much as possible to lessen swelling.  Wear sturdy, supportive shoes. The shoes should not pinch the toes or fit tightly against the toes.  Your caregiver may prescribe a rigid shoe if your foot is very swollen.  Your may be given crutches if the pain is too great and it hurts too much to  walk.  Only take over-the-counter or prescription medicines for pain, discomfort, or fever as directed by your caregiver.  If your caregiver has given you a follow-up appointment, it is very important to keep that appointment. Not keeping the appointment could result in a chronic or permanent injury, pain, and disability. If there is any problem keeping the appointment, you must call back to this facility for assistance. SEEK MEDICAL CARE IF:   You have increased pain or swelling, not relieved with medications.  The pain does not get better after 1 week.  Your injured toe is cold when the others are warm. SEEK IMMEDIATE MEDICAL CARE IF:   The toe becomes cold, numb, or white.  The toe becomes hot (inflamed) and red. Document Released: 02/10/2000 Document Revised: 05/07/2011 Document Reviewed: 09/29/2007 [facility] Patient Information 2015 [employer] [place]. This information is not intended to replace advice given to you by your health care provider. Make sure you discuss any questions you have with your health care provider.

## 2013-12-16 NOTE — ED Provider Notes (Signed)
CSN: [id number]     Arrival date & time 12/16/13  2126 History   First MD Initiated Contact with Patient 12/16/13 2225     Chief Complaint  Patient presents with  . Foot Pain     (Consider location/radiation/quality/duration/timing/severity/associated sxs/prior Treatment) HPI Comments: Pt sustained fractures of the left foot and injury to the right foot earlier during the month of Oct. Pt states she had the cast removed form the left foot on Friday Oct. 16. 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 She notices swelling and increased pain. No other injury reported. Pt presents to the ED for evaluation and assistance with her pain.  The history is provided by the patient.    Past Medical History  Diagnosis Date  . Asthma   . Knee pain    Past Surgical History  Procedure Laterality Date  . Orthopedic surgery     History reviewed. No pertinent family history. History  Substance Use Topics  . Smoking status: Current Every Day Smoker  . Smokeless tobacco: Not on file  . Alcohol Use: No     Comment: quit in July 2015   OB History   Grav Para Term Preterm Abortions TAB SAB Ect Mult Living                 Review of Systems  Constitutional: Negative for activity change.       All ROS Neg except as noted in HPI  HENT: Negative for nosebleeds.   Eyes: Negative for photophobia and discharge.  Respiratory: Negative for cough, shortness of breath and wheezing.   Cardiovascular: Negative for chest pain and palpitations.  Gastrointestinal: Negative for abdominal pain and blood in stool.  Genitourinary: Negative for dysuria, frequency and hematuria.  Musculoskeletal: Positive for arthralgias. Negative for back pain and neck pain.  Skin: Negative.   Neurological: Negative for dizziness, seizures and speech difficulty.  Psychiatric/Behavioral: Negative for hallucinations and confusion.      Allergies  Codeine; Hydrocodone; Ibuprofen; Neurontin;  Penicillins; and Tramadol  Home Medications   Prior to Admission medications   Medication Sig Start Date End Date Taking? Authorizing Provider  gabapentin (NEURONTIN) 300 MG capsule Take 300 mg by mouth 3 (three) times daily.    Historical Provider, MD  HYDROcodone-acetaminophen (NORCO) 7.5-325 MG per tablet Take 1 tablet by mouth every 4 (four) hours as needed. 12/05/13   Kathie Dike, PA-C  hydrOXYzine (ATARAX/VISTARIL) 25 MG tablet Take 25 mg by mouth 3 (three) times daily.    Historical Provider, MD  medroxyPROGESTERone (PROVERA) 10 MG tablet Take 1 tablet (10 mg total) by mouth daily. 07/03/13   Dione Booze, MD  oxyCODONE-acetaminophen (PERCOCET/ROXICET) 5-325 MG per tablet Take 1 tablet by mouth every 4 (four) hours as needed for severe pain. 06/07/13   Dione Booze, MD  oxyCODONE-acetaminophen (PERCOCET/ROXICET) 5-325 MG per tablet Take 1 tablet by mouth every 4 (four) hours as needed. 06/07/13   Dione Booze, MD  oxyCODONE-acetaminophen (PERCOCET/ROXICET) 5-325 MG per tablet Take 1 tablet by mouth every 4 (four) hours as needed. 07/03/13   Dione Booze, MD  PARoxetine (PAXIL) 40 MG tablet Take 40 mg by mouth every morning.    Historical Provider, MD   BP 111/53  Pulse 80  Temp(Src) 98.6 F (37 C) (Oral)  Resp 20  Ht 5\' 1"  (1.549 m)  Wt 130 lb (58.968 kg)  BMI 24.58 kg/m2  SpO2 100%  LMP 12/16/2013 Physical Exam  Nursing note  and vitals reviewed. Constitutional: She is oriented to person, place, and time. She appears well-developed and well-nourished.  Non-toxic appearance.  HENT:  Head: Normocephalic.  Right Ear: Tympanic membrane and external ear normal.  Left Ear: Tympanic membrane and external ear normal.  Eyes: EOM and lids are normal. Pupils are equal, round, and reactive to light.  Neck: Normal range of motion. Neck supple. Carotid bruit is not present.  Cardiovascular: Normal rate, regular rhythm, normal heart sounds, intact distal pulses and normal pulses.    Pulmonary/Chest: Breath sounds normal. No respiratory distress.  Abdominal: Soft. Bowel sounds are normal. There is no tenderness. There is no guarding.  Musculoskeletal: Normal range of motion.  There is swelling of the toes of the left foot. There is swelling of the dorsum of the left foot. DP is 2+, PT is 2+. Achilles is intact. Compartments are soft. FROM of the left knee.  Lymphadenopathy:       Head (right side): No submandibular adenopathy present.       Head (left side): No submandibular adenopathy present.    She has no cervical adenopathy.  Neurological: She is alert and oriented to person, place, and time. She has normal strength. No cranial nerve deficit or sensory deficit.  Skin: Skin is warm and dry.  Psychiatric: She has a normal mood and affect. Her speech is normal.    ED Course  Procedures (including critical care time) Labs Review Labs Reviewed - No data to display  Imaging Review Dg Ankle Complete Left  12/16/2013   CLINICAL DATA:  43 year old female with recent casting 4 toe fracture. Re injured the left foot an ankle today, twisting injury. Acute pain. Initial encounter.  EXAM: LEFT ANKLE COMPLETE - 3+ VIEW  COMPARISON:  Left foot series from today reported separately.  FINDINGS: Calcaneus intact. Mortise joint alignment preserved. Talar dome intact. No ankle joint effusion. Distal tibia and fibula intact.  IMPRESSION: 1. No acute fracture or dislocation identified about the left ankle. 2. See left foot series from today reported separately.   Electronically Signed   By: Augusto GambleLee  Hall M.D.   On: 12/16/2013 22:29   Dg Foot Complete Left  12/16/2013   CLINICAL DATA:  43 year old with left foot pain, recent cast removal for toe fracture. Initial encounter.  EXAM: LEFT FOOT - COMPLETE 3+ VIEW  [facility].  FINDINGS: Spiral mildly comminuted fracture of the second proximal phalanx appears not significantly changed from  the prior study. There is faint periosteal reaction at the proximal and distal aspects of the fracture. Alignment is stable and near anatomic.  Other phalanges appear intact. Joint spaces and alignment within normal limits.  However, there is also a healing comminuted more transverse fracture of the first metatarsal, with exuberant periosteal reaction. There are also acute appearing comminuted fractures at the base of the second and third metatarsals. These likely are intra-articular. No definite acute fourth or fifth metatarsal fracture.  Accessory appearing ossicle at the navicular.  Calcaneus intact.  IMPRESSION: 1. Acute appearing comminuted intra-articular fractures at the base of the second and third metatarsals. 2. Healing comminuted fracture through the midshaft of the first metatarsal. 3. Spiral fracture left second proximal phalanx with minimal healing since 12/06/2013.   Electronically Signed   By: Augusto GambleLee  Hall M.D.   On: 12/16/2013 22:28     EKG Interpretation None      MDM  The fracture of the left 2nd toe and the left first  toe show evidence of healing. Pt has a new fracture of the base of the 2nd and 3rd metatarsals. Pt states she still has her fracture boots and would like to use them. She request assistance with her pain.  Review of the pt's records reveals the patient received Rx for percocet 2 or 3 days ago from her MD in [place]. She has received pain meds from this ED on 2 previous visits to the ED this month. There is a notation concerning [facility] in [facility] report, but I can not find ED records at this time [facility]. Pt was treated in the ED, and ask to use the medication from 2 days ago for her pain. She was also ask to see her MD in [place] for her pain management.   Final diagnoses:  Toe fracture, left, closed, initial encounter    **I have reviewed nursing notes, vital signs, and all appropriate  lab and imaging results for this patient.Kathie Dike*    Aven Christen M Laiyah Exline, PA-C 12/17/13 [phone]

## 2013-12-16 NOTE — ED Notes (Signed)
Pt states her left foot buckled on her when rearranging furniture. Pt states she got cast off the same foot Friday.

## 2013-12-18 NOTE — ED Provider Notes (Signed)
Medical screening examination/treatment/procedure(s) were performed by non-physician practitioner and as supervising physician I was immediately available for consultation/collaboration.    Vida RollerBrian D Armando Bukhari, MD 12/18/13 [phone]

## 2013-12-25 ENCOUNTER — Emergency Department (HOSPITAL_COMMUNITY): Payer: Self-pay

## 2013-12-25 ENCOUNTER — Encounter (HOSPITAL_COMMUNITY): Payer: Self-pay | Admitting: Emergency Medicine

## 2013-12-25 ENCOUNTER — Emergency Department (HOSPITAL_COMMUNITY)
Admission: EM | Admit: 2013-12-25 | Discharge: 2013-12-25 | Disposition: A | Discharge status: Home / Self Care | Payer: Self-pay | Attending: Emergency Medicine | Admitting: Emergency Medicine

## 2013-12-25 DIAGNOSIS — Z720 Tobacco use: Secondary | ICD-10-CM | POA: Insufficient documentation

## 2013-12-25 DIAGNOSIS — Z8781 Personal history of (healed) traumatic fracture: Secondary | ICD-10-CM | POA: Insufficient documentation

## 2013-12-25 DIAGNOSIS — Z880 Allergy status to penicillin: Secondary | ICD-10-CM | POA: Insufficient documentation

## 2013-12-25 DIAGNOSIS — M79605 Pain in left leg: Secondary | ICD-10-CM | POA: Insufficient documentation

## 2013-12-25 DIAGNOSIS — J45909 Unspecified asthma, uncomplicated: Secondary | ICD-10-CM | POA: Insufficient documentation

## 2013-12-25 DIAGNOSIS — G8929 Other chronic pain: Secondary | ICD-10-CM | POA: Insufficient documentation

## 2013-12-25 DIAGNOSIS — R52 Pain, unspecified: Secondary | ICD-10-CM

## 2013-12-25 DIAGNOSIS — M79672 Pain in left foot: Secondary | ICD-10-CM

## 2013-12-25 MED ORDER — OXYCODONE-ACETAMINOPHEN 5-325 MG PO TABS
1.0000 | ORAL_TABLET | Freq: Once | ORAL | Status: AC
  Administered 2013-12-25: 1 via ORAL
  Filled 2013-12-25: qty 1

## 2013-12-25 NOTE — ED Notes (Signed)
Patient states "i can only take percocet for pain"

## 2013-12-25 NOTE — ED Provider Notes (Signed)
CSN: [id number]     Arrival date & time 12/25/13  1954 History   First MD Initiated Contact with Patient 12/25/13 2048     Chief Complaint  Patient presents with  . Leg Injury     (Consider location/radiation/quality/duration/timing/severity/associated sxs/prior Treatment) HPI   Kelsey Gibbs is a 43 y.o. female who presents to the Emergency Department complaining of left foot pain.  She reports hx of several fractures to her left foot that occurred several weeks ago.  [facility] and patient states she has ran out of her medications and does not have another orthopedic appt until next week.  She states the pain to her foot became worse earlier after a large fan fell over on her foot.  She reports increased pain and swelling of her foot.  She denies other injuries, numbness or weakness of her leg leg.     Past Medical History  Diagnosis Date  . Asthma   . Knee pain    Past Surgical History  Procedure Laterality Date  . Orthopedic surgery     History reviewed. No pertinent family history. History  Substance Use Topics  . Smoking status: Current Every Day Smoker -- 1.00 packs/day  . Smokeless tobacco: Not on file  . Alcohol Use: No     Comment: quit in July 2015   OB History   Grav Para Term Preterm Abortions TAB SAB Ect Mult Living                 Review of Systems  Constitutional: Negative for fever and chills.  Musculoskeletal: Positive for joint swelling and arthralgias. Negative for back pain.  Skin: Negative for color change and wound.  Neurological: Negative for weakness and numbness.  All other systems reviewed and are negative.     Allergies  Codeine; Hydrocodone; Ibuprofen; Penicillins; and Tramadol  Home Medications   Prior to Admission medications   Medication Sig Start Date End Date Taking? Authorizing Provider  gabapentin (NEURONTIN) 300 MG capsule Take 300 mg by mouth 3 (three) times daily.   Yes Historical  Provider, MD  hydrOXYzine (ATARAX/VISTARIL) 25 MG tablet Take 25 mg by mouth 3 (three) times daily.   Yes Historical Provider, MD  PARoxetine (PAXIL) 40 MG tablet Take 40 mg by mouth every morning.   Yes Historical Provider, MD   BP 101/57  Pulse 88  Temp(Src) 98.7 F (37.1 C) (Oral)  Resp 20  Ht 5\' 1"  (1.549 m)  Wt 130 lb (58.968 kg)  BMI 24.58 kg/m2  SpO2 100%  LMP 12/16/2013 Physical Exam  Constitutional: She is oriented to person, place, and time. She appears well-developed and well-nourished. No distress.  HENT:  Head: Normocephalic and atraumatic.  Cardiovascular: Normal rate, regular rhythm, normal heart sounds and intact distal pulses.   No murmur heard. Pulmonary/Chest: Effort normal and breath sounds normal. No respiratory distress.  Musculoskeletal: She exhibits edema and tenderness.  Diffuse ttp of the dorsal left foot.  Moderate STS.  DP pulse is brisk,distal sensation intact.  No erythema, abrasion, bruising or bony deformity.  No proximal tenderness.  Compartments of the left LE are soft.  Neurological: She is alert and oriented to person, place, and time. She exhibits normal muscle tone. Coordination normal.  Skin: Skin is warm and dry.  Nursing note and vitals reviewed.   ED Course  Procedures (including critical care time) Labs Review Labs Reviewed - No data to display  Imaging Review Dg Foot  Complete Left  12/25/2013   CLINICAL DATA:  Hit left foot on bedroom fan. Left foot pain. Recent foot fractures. Subsequent encounter.  EXAM: LEFT FOOT - COMPLETE 3+ VIEW  COMPARISON:  12/16/2013  FINDINGS: No evidence of acute fracture or dislocation.  Subacute healing fractures are again seen involving the proximal phalanx of the second toe, the first metatarsal shaft, and bases of the second and third metatarsals, which show partial healing.  IMPRESSION: No acute fractures or dislocation identified.  Subacute, incompletely healed fractures of the second toe proximal  phalanx, first metatarsal shaft, and bases of the second and third metatarsals.   Electronically Signed   By: Myles RosenthalJohn  Stahl M.D.   On: 12/25/2013 21:06     EKG Interpretation None      MDM   Final diagnoses:  Pain  Pain, foot, chronic, left   Patient with localized edema of the dorsal left foot.  NV intact.  CR< 2 sec.  No acute fx's seen on the XR tonight.    She was seen here 12/16/13 for foot pain as well after another stated injury  [facility] and she has been receiving multiple narcotic prescriptions from different providers with 7 narcotic prescriptions just this month.  I have discussed this with the patient and she understands that no further narcotics will be prescribes on this visit.      Zaxton Angerer L. Trisha Mangleriplett, PA-C 12/27/13 1623  Raeford RazorStephen Kohut, MD 12/31/13 1038

## 2013-12-25 NOTE — ED Notes (Signed)
Patient states "i think I re-broke my foot" patient states she kicked a fan with her left foot and it fell on top of her foot, patient c/o left foot pain and swelling.

## 2016-03-04 IMAGING — CR DG TOE 2ND 2+V*L*
3 series · 3 of 3 positions shown · non-contrast
Comparison: none

CLINICAL DATA: Left second digit pain.  Trauma.  Hit toe on couch.

EXAM:
LEFT SECOND TOE

[view not recorded (1 of 3)]
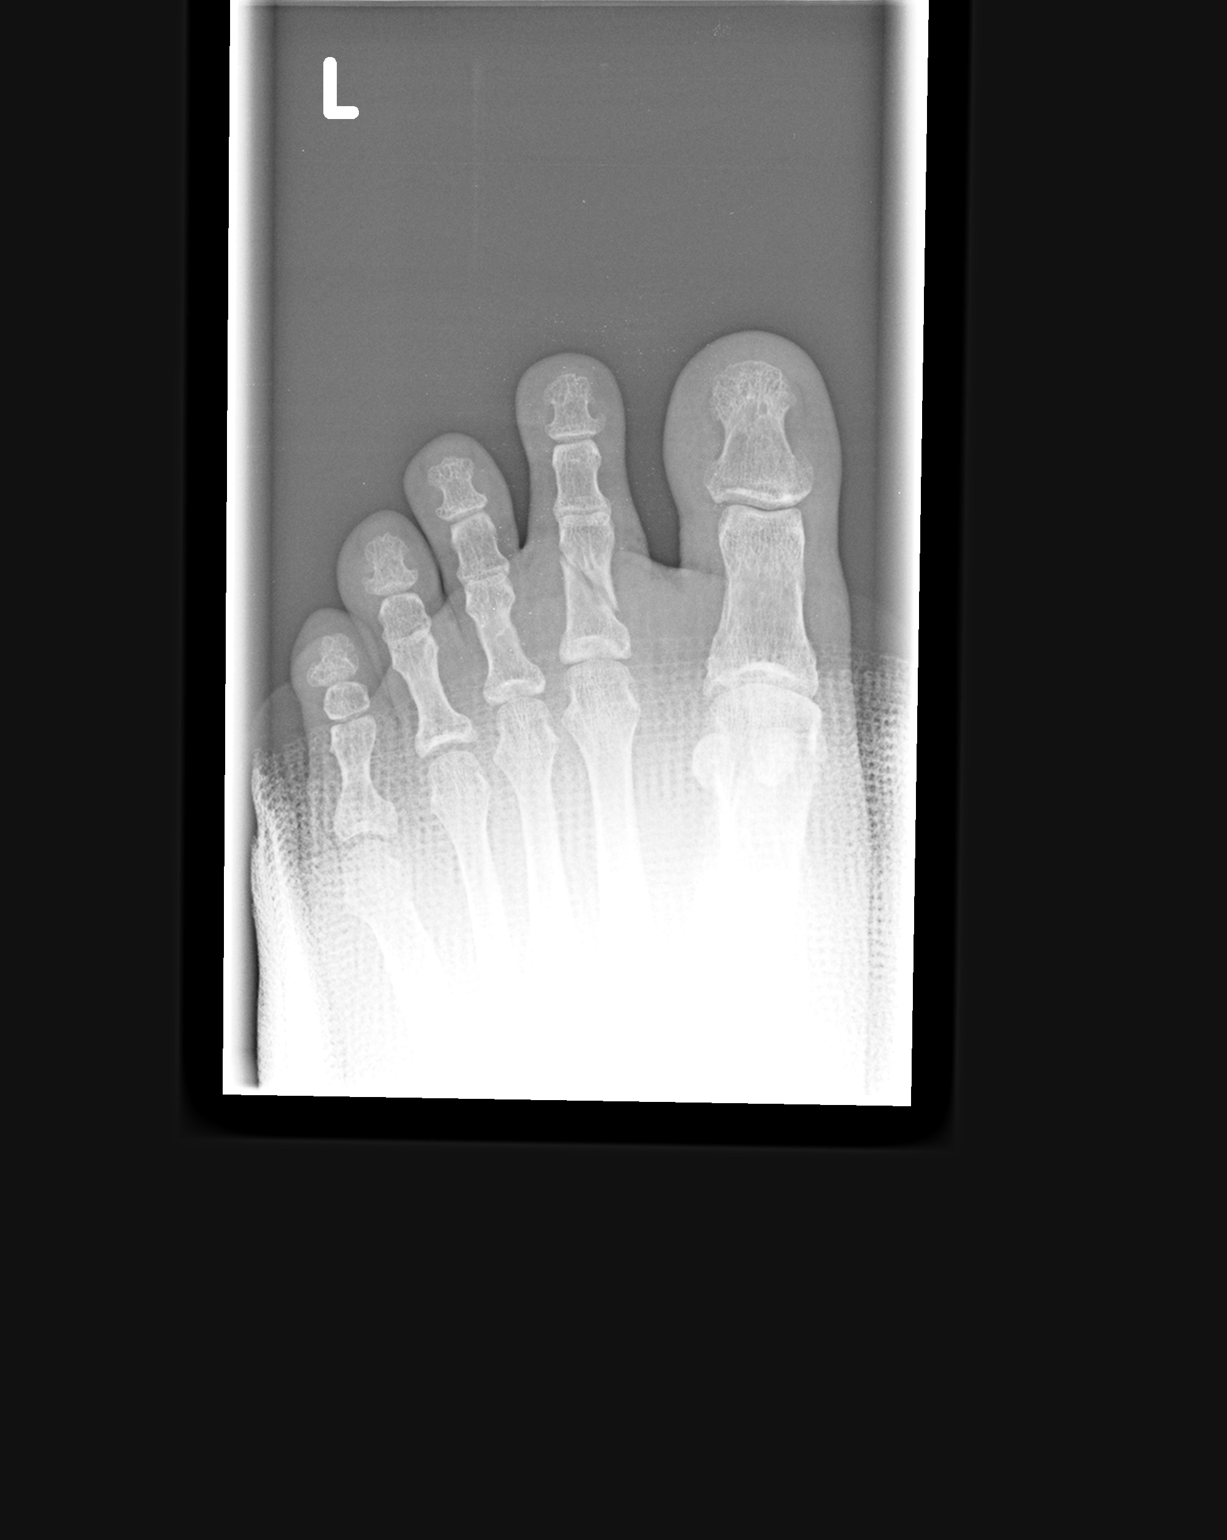

[view not recorded (2 of 3)]
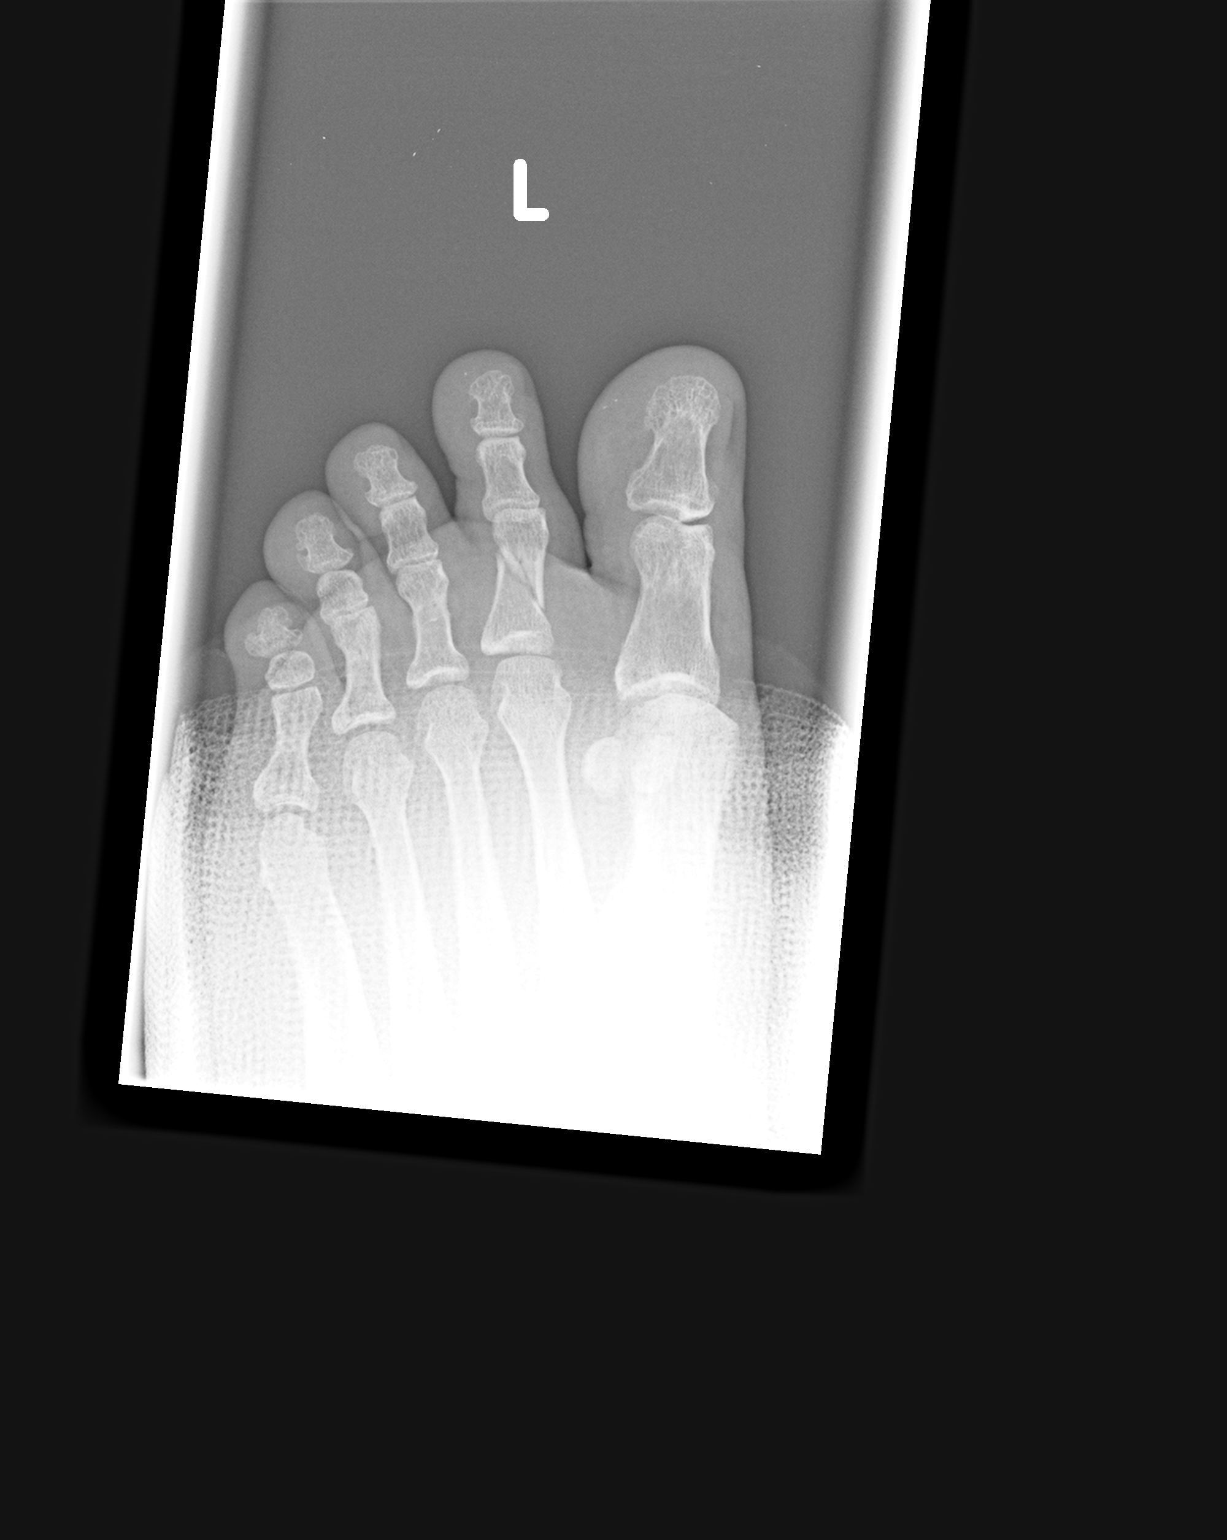

[view not recorded (3 of 3)]
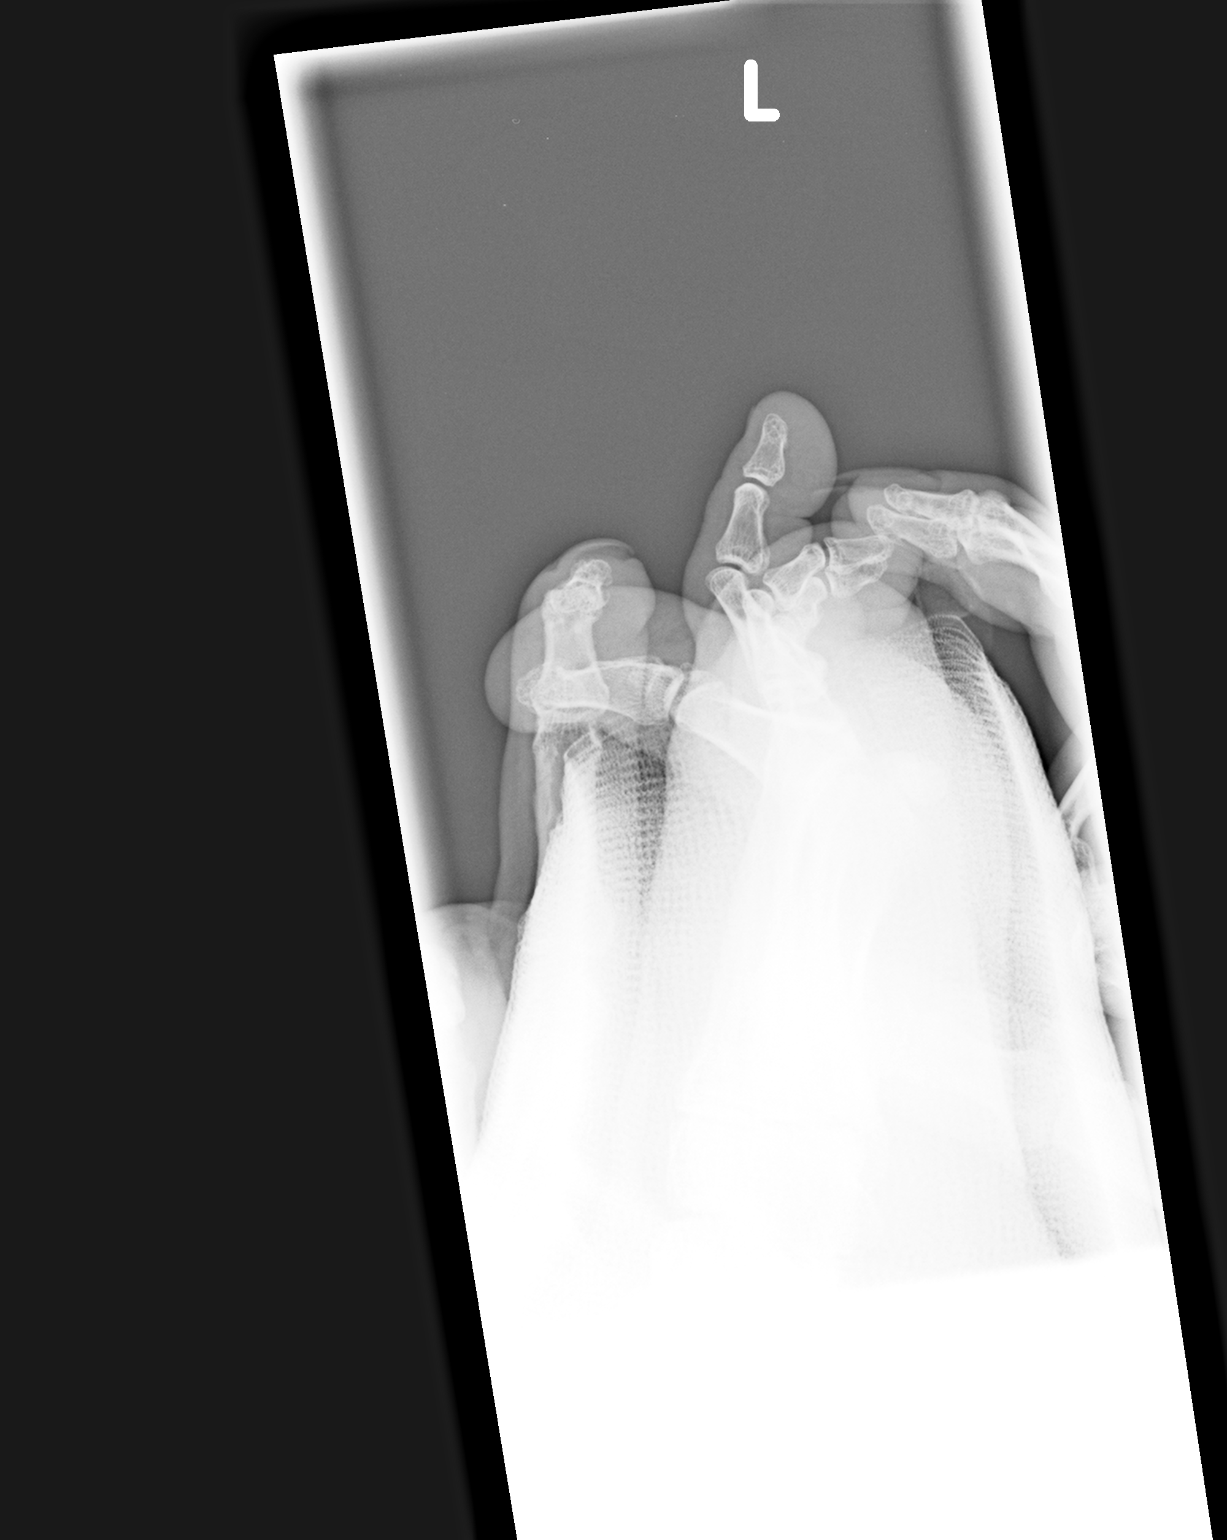

[3 of 3 positions shown; findings below may reference images not displayed]

FINDINGS: Oblique fracture of the proximal phalanx of left second digit is
noted. Fracture slightly displaced. No foreign body.
IMPRESSION: An oblique fracture of the proximal phalanx of the second digit is
present.
